# Patient Record
Sex: Male | Born: 1960
Health system: Southern US, Community
[De-identification: ages and names within clinical notes are randomized; demographics above are authoritative.]

## PROBLEM LIST (undated history)

## (undated) DIAGNOSIS — N529 Male erectile dysfunction, unspecified: Secondary | ICD-10-CM

## (undated) DIAGNOSIS — C61 Malignant neoplasm of prostate: Secondary | ICD-10-CM

## (undated) HISTORY — DX: Malignant neoplasm of prostate: C61

## (undated) HISTORY — DX: Male erectile dysfunction, unspecified: N52.9

---

## 1980-04-16 HISTORY — PX: OTHER SURGICAL HISTORY: SHX169

## 2012-12-03 HISTORY — PX: PROSTATE BIOPSY: SHX241

## 2012-12-29 ENCOUNTER — Institutional Professional Consult (permissible substitution): Payer: Self-pay | Admitting: Radiation Oncology

## 2012-12-29 ENCOUNTER — Ambulatory Visit: Payer: 59

## 2013-01-02 ENCOUNTER — Encounter: Payer: Self-pay | Admitting: Radiation Oncology

## 2013-01-02 DIAGNOSIS — C61 Malignant neoplasm of prostate: Secondary | ICD-10-CM | POA: Insufficient documentation

## 2013-01-02 HISTORY — DX: Malignant neoplasm of prostate: C61

## 2013-01-02 NOTE — Progress Notes (Signed)
GU Location of Tumor / Histology: adenocarcinoma of the prostate  If Prostate Cancer, Gleason Score is (3 + 3=6) and PSA is (3.53)  Patient presented in August with an elevated PSA of 4.63.  Biopsies of prostate (if applicable) revealed:     Past/Anticipated interventions by urology, if any: Dr. Annabell Howells referred patient to Dr. Kathrynn Running  Past/Anticipated interventions by medical oncology, if any: NONE  Weight changes, if any: None noted  Bowel/Bladder complaints, if any: mild ED and minimal voiding difficulties IPSS 3   Nausea/Vomiting, if any: None noted  Pain issues, if any:  None noted  SAFETY ISSUES:  Prior radiation? NO  Pacemaker/ICD? NO  Possible current pregnancy? NO  Is the patient on methotrexate? NO  Current Complaints / other details:  52 year old. Married. NKDA. Prostate volume: 24 cc. [employer] Location manager. T1cNxMx. Patient most interested in brachytherapy.

## 2013-01-05 ENCOUNTER — Encounter: Payer: Self-pay | Admitting: Radiation Oncology

## 2013-01-05 ENCOUNTER — Ambulatory Visit
Admission: RE | Admit: 2013-01-05 | Discharge: 2013-01-05 | Disposition: A | Discharge status: Home / Self Care | Payer: 59 | Source: Ambulatory Visit | Attending: Radiation Oncology | Admitting: Radiation Oncology

## 2013-01-05 ENCOUNTER — Telehealth: Payer: Self-pay | Admitting: *Deleted

## 2013-01-05 VITALS — BP 133/81 | HR 61 | Temp 98.0°F | Resp 16 | Ht 72.0 in | Wt 209.9 lb

## 2013-01-05 DIAGNOSIS — C61 Malignant neoplasm of prostate: Secondary | ICD-10-CM

## 2013-01-05 NOTE — Telephone Encounter (Signed)
Called patient to inform of pre-seed appt. For 01-09-13- arrival time - 8:30 am, spoke with patient and he is aware of this appt.

## 2013-01-05 NOTE — Progress Notes (Signed)
Reports on average he gets up once during the night to void. Denies burning with urination. Denies hematuria. Denies urinary frequency during the day reporting he can go 2-3 hours without voiding however, if he drinks more he goes more. Reports a strong urine stream and denies difficulty emptying. Denies pain associated with bowel movements, blood in stool or diarrhea. Denies any unintentional weight loss. Reports a good appetite. Reports occasional night sweats. Denies bone pain. Denies nausea, vomiting, headaches or dizziness.    Most interested in brachytherapy. IPSS 1.

## 2013-01-05 NOTE — Progress Notes (Signed)
See progress note under physician encounter. 

## 2013-01-05 NOTE — Progress Notes (Signed)
Radiation Oncology         [phone]) [phone] ________________________________  Initial outpatient Consultation  Name: Seth Hammond MRN: [id number]  Date: 01/05/2013  DOB: [birth year]  CC:No primary provider on file.  Bjorn Pippin, MD   REFERRING PHYSICIAN: Bjorn Pippin, MD  DIAGNOSIS: 52 y.o. gentleman with stage T1c adenocarcinoma of the prostate with a Gleason's score of 3+3 and a PSA of 4.63  HISTORY OF PRESENT ILLNESS::Seth Hammond is a 52 y.o. gentleman.  He was noted to have an elevated PSA of 4.63 by his primary care physician, Dr. Mauricio Hammond.  Accordingly, he was referred for evaluation in urology by Dr. Annabell Howells on 11/19/12,  digital rectal examination was performed at that time revealing no nodules, although the left lobe was somewhat more prominent than the right.  Repeat PSA at that time was 3.53. The patient proceeded to transrectal ultrasound with 12 biopsies of the prostate on 12/03/12.  The prostate volume measured 24 cc.  Out of 12 core biopsies, 3 were positive.  The maximum Gleason score was 3+3, and this was seen in areas shown below.  The patient reviewed the biopsy results with his urologist and he has kindly been referred today for discussion of potential radiation treatment options.  PREVIOUS RADIATION THERAPY: No  PAST MEDICAL HISTORY:  has a past medical history of Hypertrophy of prostate without urinary obstruction and other lower urinary tract symptoms (LUTS); Impotence of organic origin; Malignant neoplasm of prostate; Elevated prostate specific antigen (PSA); and Prostate cancer (01/02/2013).    PAST SURGICAL HISTORY: Past Surgical History  Procedure Laterality Date  . Prostate biopsy  12/03/2012  . Orchlopexy inguinal approach left      FAMILY HISTORY: family history includes Aneurysm in his father; Cancer in his maternal uncle and paternal uncle.  SOCIAL HISTORY:  reports that he has never smoked. He has never used smokeless tobacco. He reports that he does not drink  alcohol or use illicit drugs.  ALLERGIES: Review of patient's allergies indicates no known allergies.  MEDICATIONS:  Current Outpatient Prescriptions  Medication Sig Dispense Refill  . valACYclovir (VALTREX) 500 MG tablet        No current facility-administered medications for this encounter.    REVIEW OF SYSTEMS:  A 15 point review of systems is documented in the electronic medical record. This was obtained by the nursing staff. However, I reviewed this with the patient to discuss relevant findings and make appropriate changes.  A comprehensive review of systems was negative..  The patient completed an IPSS and IIEF questionnaire.  His IPSS score was 1 indicating mild urinary outflow obstructive symptoms.  He indicated that his erectile function is able to complete sexual activity more than half the time.   PHYSICAL EXAM: This patient is in no acute distress.  He is alert and oriented.   height is 6' (1.829 m) and weight is 209 lb 14.4 oz (95.21 kg). His oral temperature is 98 F (36.7 C). His blood pressure is 133/81 and his pulse is 61. His respiration is 16 and oxygen saturation is 100%.  He exhibits no respiratory distress or labored breathing.  He appears neurologically intact.  His mood is pleasant.  His affect is appropriate.  Please note the digital rectal exam findings described above.  KPS = 100    IMPRESSION: This gentleman is a 52 y.o. gentleman with stage T1c adenocarcinoma of the prostate with a Gleason's score of 3+3 and a PSA of 4.63.  His T-Stage, Gleason's Score,  and PSA put him into the favorable risk group.  Accordingly he is eligible for a variety of potential treatment options including prostatectomy, IMRT or seed implant.  PLAN:Today I reviewed the findings and workup thus far.  We discussed the natural history of prostate cancer.  We reviewed the the implications of T-stage, Gleason's Score, and PSA on decision-making and outcomes in prostate cancer.  We discussed  radiation treatment in the management of prostate cancer with regard to the logistics and delivery of external beam radiation treatment as well as the logistics and delivery of prostate brachytherapy.  We compared and contrasted each of these approaches and also compared these against prostatectomy.  The patient expressed interest in prostate brachytherapy.  I filled out a patient counseling form for him with relevant treatment diagrams and we retained a copy for our records.   The patient would like to proceed with prostate brachytherapy.  I will share my findings with Dr. Annabell Howells and move forward with scheduling the procedure in the near future.     I enjoyed meeting with him today, and will look forward to participating in the care of this very nice gentleman.  I spent 40 minutes face to face with the patient and more than 50% of that time was spent in counseling and/or coordination of care.   ------------------------------------------------  Artist Pais. Kathrynn Running, M.D.

## 2013-01-05 NOTE — Progress Notes (Signed)
Complete PATIENT MEASURE OF DISTRESS worksheet with a score of 1 submitted to social work. 

## 2013-01-06 ENCOUNTER — Telehealth: Payer: Self-pay | Admitting: *Deleted

## 2013-01-06 NOTE — Telephone Encounter (Signed)
CALLED PATIENT TO INFORM OF IMPLANT DATE, LVM FOR A RETURN CALL 

## 2013-01-07 ENCOUNTER — Other Ambulatory Visit: Payer: Self-pay | Admitting: Urology

## 2013-01-08 ENCOUNTER — Telehealth: Payer: Self-pay | Admitting: *Deleted

## 2013-01-08 NOTE — Telephone Encounter (Signed)
CALLED PATIENT TO REMIND OF APPTS. FOR 01-09-13, SPOKE WITH PATIENT AND HE IS AWARE OF THESE APPTS.

## 2013-01-09 ENCOUNTER — Encounter: Payer: Self-pay | Admitting: Radiation Oncology

## 2013-01-09 ENCOUNTER — Ambulatory Visit
Admission: RE | Admit: 2013-01-09 | Discharge: 2013-01-09 | Disposition: A | Discharge status: Home / Self Care | Payer: 59 | Source: Ambulatory Visit | Attending: Radiation Oncology | Admitting: Radiation Oncology

## 2013-01-09 ENCOUNTER — Encounter (HOSPITAL_BASED_OUTPATIENT_CLINIC_OR_DEPARTMENT_OTHER)
Admission: RE | Admit: 2013-01-09 | Discharge: 2013-01-09 | Disposition: A | Discharge status: Home / Self Care | Payer: 59 | Source: Ambulatory Visit | Attending: Urology | Admitting: Urology

## 2013-01-09 ENCOUNTER — Ambulatory Visit (HOSPITAL_COMMUNITY)
Admission: RE | Admit: 2013-01-09 | Discharge: 2013-01-09 | Disposition: A | Discharge status: Home / Self Care | Payer: 59 | Source: Ambulatory Visit | Attending: Urology | Admitting: Urology

## 2013-01-09 ENCOUNTER — Other Ambulatory Visit: Payer: Self-pay

## 2013-01-09 DIAGNOSIS — C61 Malignant neoplasm of prostate: Secondary | ICD-10-CM

## 2013-01-09 DIAGNOSIS — R9431 Abnormal electrocardiogram [ECG] [EKG]: Secondary | ICD-10-CM | POA: Insufficient documentation

## 2013-01-09 DIAGNOSIS — I252 Old myocardial infarction: Secondary | ICD-10-CM | POA: Insufficient documentation

## 2013-01-09 DIAGNOSIS — Z01818 Encounter for other preprocedural examination: Secondary | ICD-10-CM | POA: Insufficient documentation

## 2013-01-09 DIAGNOSIS — Z01810 Encounter for preprocedural cardiovascular examination: Secondary | ICD-10-CM | POA: Insufficient documentation

## 2013-01-09 NOTE — Progress Notes (Signed)
  Radiation Oncology         [phone]) [phone] ________________________________  Name: Seth Hammond MRN: [id number]  Date: 01/09/2013  DOB: [birth year]  SIMULATION AND TREATMENT PLANNING NOTE PUBIC ARCH STUDY  CC:No primary provider on file.  Bjorn Pippin, MD  DIAGNOSIS: 53 y.o. gentleman with stage T1c adenocarcinoma of the prostate with a Gleason's score of 3+3 and a PSA of 4.63  COMPLEX SIMULATION:  The patient presented today for evaluation for possible prostate seed implant. He was brought to the radiation planning suite and placed supine on the CT couch. A 3-dimensional image study set was obtained in upload to the planning computer. There, on each axial slice, I contoured the prostate gland. Then, using three-dimensional radiation planning tools I reconstructed the prostate in view of the structures from the transperineal needle pathway to assess for possible pubic arch interference. In doing so, I did not appreciate any pubic arch interference. Also, the patient's prostate volume was estimated based on the drawn structure. The volume was 34 cc which is close to the ultrasound volume of 24 cc, plus the increase in size typical on CT.  Given the pubic arch appearance and prostate volume, patient remains a good candidate to proceed with prostate seed implant. Today, he freely provided informed written consent to proceed.    PLAN: The patient will undergo prostate seed implant.   ________________________________  Artist Pais. Kathrynn Running, M.D.

## 2013-01-26 ENCOUNTER — Encounter: Payer: Self-pay | Admitting: Radiation Oncology

## 2013-01-28 ENCOUNTER — Telehealth: Payer: Self-pay | Admitting: Radiation Oncology

## 2013-01-28 NOTE — Telephone Encounter (Signed)
Phoned patient. No answer. Left message informing him requested work letter is ready for pick up.

## 2013-01-30 ENCOUNTER — Telehealth: Payer: Self-pay | Admitting: Radiation Oncology

## 2013-01-30 ENCOUNTER — Encounter: Payer: Self-pay | Admitting: Radiation Oncology

## 2013-01-30 NOTE — Telephone Encounter (Signed)
Corrected date on letter to December 3 to ensure patient is paid for time out of work following surgery per [facility] guidelines. Patient apologized for confusion and expressed appreciation that matter was handled so quickly.

## 2013-02-25 ENCOUNTER — Telehealth: Payer: Self-pay | Admitting: *Deleted

## 2013-02-25 NOTE — Telephone Encounter (Signed)
CALLED PATIENT TO REMIND OF APPT. FOR LAB ON 02-26-13, LVM FOR A RETURN CALL

## 2013-02-26 LAB — COMPREHENSIVE METABOLIC PANEL
AST: 24 U/L (ref 0–37)
Albumin: 3.7 g/dL (ref 3.5–5.2)
BUN: 15 mg/dL (ref 6–23)
Calcium: 9.2 mg/dL (ref 8.4–10.5)
Chloride: 103 mEq/L (ref 96–112)
Creatinine, Ser: 1.05 mg/dL (ref 0.50–1.35)
GFR calc non Af Amer: 79 mL/min — ABNORMAL LOW (ref >90)
Potassium: 4 mEq/L (ref 3.5–5.1)
Total Bilirubin: 0.3 mg/dL (ref 0.3–1.2)

## 2013-02-26 LAB — PROTIME-INR
INR: 0.98 (ref 0.00–1.49)
Prothrombin Time: 12.8 seconds (ref 11.6–15.2)

## 2013-02-26 LAB — CBC
HCT: 46.6 % (ref 39.0–52.0)
MCH: 27.9 pg (ref 26.0–34.0)
MCV: 82.3 fL (ref 78.0–100.0)
Platelets: 153 10*3/uL (ref 150–400)
RDW: 14.3 % (ref 11.5–15.5)
WBC: 3.9 10*3/uL — ABNORMAL LOW (ref 4.0–10.5)

## 2013-03-02 ENCOUNTER — Encounter (HOSPITAL_BASED_OUTPATIENT_CLINIC_OR_DEPARTMENT_OTHER): Payer: Self-pay | Admitting: *Deleted

## 2013-03-02 NOTE — Progress Notes (Signed)
NPO AFTER MN WITH EXCEPTION CLEAR LIQUIDS UNTIL 0800. ARRIVE AT 1230. CURRENT LAB RESULTS, CXR, AND EKG IN EPIC AND CHART. WILL DO FLEET ENEMA AM DOS. 

## 2013-03-03 NOTE — H&P (Signed)
eason For Visit  [name] presents today for a pre op appointment   History of Present Illness         [name] Si had a prostate biopsy that was done for a PSA of 3.53 and prostate asymmetry.  His prostate was 24cc and he had 3 cores with Gleason 6 in the right prostate with 5-30% involvement.  He has T1c Nx Mx disease with a [place] score of 1 and a low risk D'Amico classification.  His IPSS was 3 and his SHIM was 18.  After discussing all of his options for treatment, he has elected to proceed with brachytherapy.  His seeds are scheduled to be implanted on 03/05/13.  Interval history: [name] presents today for his pre op appointment.  He is doing well today.  He denies any bothersome LUTS, dysuria, or hematuria.  He denies any pain.  He does not have any questions about his upcoming surgery. He is without complaints today.   Past Medical History Problems   1. History of No Medical Problems  Surgical History Problems   1. History of Orchiopexy Inguinal Approach Left  Current Meds  1. Fluticasone Propionate 50 MCG/ACT Nasal Suspension;  Therapy: 21Oct2014 to Recorded  2. ValACYclovir HCl - 500 MG Oral Tablet;  Therapy: 21Oct2014 to Recorded  Allergies Medication   1. No Known Drug Allergies  Family History Problems   1. Family history of Death In The Family Father   Ddied age 77-Aneurysm  2. Family history of Family Health Status Number Of Children   2 sons  Social History Problems   1. Denied: History of Alcohol Use  2. Caffeine Use   1 per day  3. Marital History - Currently Married  4. Never A Smoker  5. Occupation:   Oncologist  6. Denied: History of Tobacco Use (305.1)  Review of Systems Genitourinary, constitutional and gastrointestinal system(s) were reviewed and pertinent findings if present are noted.    Vitals Vital Signs [Data Includes: Last 1 Day]  Recorded: 10Nov2014 08:33AM  Blood Pressure: 146 / 84 Temperature: 98.7 F Heart  Rate: 59  Physical Exam Constitutional: Well nourished and well developed . No acute distress.  Pulmonary: No respiratory distress and normal respiratory rhythm and effort.  Abdomen: The abdomen is soft and nontender. No CVA tenderness.  Neuro/Psych:. Mood and affect are appropriate.    Results/Data Urine [Data Includes: Last 1 Day]   10Nov2014  COLOR YELLOW   APPEARANCE CLEAR   SPECIFIC GRAVITY 1.025   pH 6.0   GLUCOSE NEG mg/dL  BILIRUBIN NEG   KETONE NEG mg/dL  BLOOD SMALL   PROTEIN NEG mg/dL  UROBILINOGEN 0.2 mg/dL  NITRITE NEG   LEUKOCYTE ESTERASE NEG   SQUAMOUS EPITHELIAL/HPF RARE   WBC 0-2 WBC/hpf  RBC 0-2 RBC/hpf  BACTERIA RARE   CRYSTALS NONE SEEN   CASTS NONE SEEN    Assessment Assessed   1. Prostate cancer (185)   [name] is doing well today.  He is asymptomatic from a GU perspective and his urine is clear.  He is prepared for his upcoming brachytherapy implantation surgery.  He will proceed as scheduled but knows to call should he have any questions or concerns.   End of Encounter Meds  Medication Name Instruction  Fluticasone Propionate 50 MCG/ACT Nasal Suspension   ValACYclovir HCl - 500 MG Oral Tablet    Plan Health Maintenance   1. UA With REFLEX; Status:Complete;   Done: 10Nov2014 08:23AM   1.  Follow up as scheduled for surgery and pre-op/ post op   Future Appointments  Date/Time Provider Specialty Site  03/05/2013 02:00 PM Bjorn Pippin, M.D. Urology [facility]  03/26/2013 11:00 AM Corky Crafts Urology [facility]

## 2013-03-04 ENCOUNTER — Telehealth: Payer: Self-pay | Admitting: *Deleted

## 2013-03-04 NOTE — Telephone Encounter (Signed)
Called patient to remind of procedure for 03-05-13, spoke with patient and he is aware of this procedure. 

## 2013-03-05 ENCOUNTER — Ambulatory Visit (HOSPITAL_BASED_OUTPATIENT_CLINIC_OR_DEPARTMENT_OTHER): Payer: 59 | Admitting: Anesthesiology

## 2013-03-05 ENCOUNTER — Ambulatory Visit (HOSPITAL_COMMUNITY): Payer: 59

## 2013-03-05 ENCOUNTER — Encounter (HOSPITAL_BASED_OUTPATIENT_CLINIC_OR_DEPARTMENT_OTHER)
Admission: RE | Disposition: A | Discharge status: Home / Self Care | Payer: Self-pay | Source: Ambulatory Visit | Attending: Urology

## 2013-03-05 ENCOUNTER — Encounter (HOSPITAL_BASED_OUTPATIENT_CLINIC_OR_DEPARTMENT_OTHER): Payer: Self-pay | Admitting: *Deleted

## 2013-03-05 ENCOUNTER — Ambulatory Visit (HOSPITAL_BASED_OUTPATIENT_CLINIC_OR_DEPARTMENT_OTHER)
Admission: RE | Admit: 2013-03-05 | Discharge: 2013-03-05 | Disposition: A | Discharge status: Home / Self Care | Payer: 59 | Source: Ambulatory Visit | Attending: Urology | Admitting: Urology

## 2013-03-05 ENCOUNTER — Encounter (HOSPITAL_BASED_OUTPATIENT_CLINIC_OR_DEPARTMENT_OTHER): Payer: 59 | Admitting: Anesthesiology

## 2013-03-05 DIAGNOSIS — C61 Malignant neoplasm of prostate: Secondary | ICD-10-CM | POA: Insufficient documentation

## 2013-03-05 HISTORY — PX: RADIOACTIVE SEED IMPLANT: SHX5150

## 2013-03-05 SURGERY — INSERTION, RADIATION SOURCE, PROSTATE
Anesthesia: General | Site: Prostate | Wound class: Clean

## 2013-03-05 MED ORDER — FLEET ENEMA 7-19 GM/118ML RE ENEM
1.0000 | ENEMA | Freq: Once | RECTAL | Status: AC
  Administered 2013-03-05: 1 via RECTAL
  Filled 2013-03-05: qty 1

## 2013-03-05 MED ORDER — FENTANYL CITRATE 0.05 MG/ML IJ SOLN
25.0000 ug | INTRAMUSCULAR | Status: DC | PRN
  Filled 2013-03-05: qty 1

## 2013-03-05 MED ORDER — CIPROFLOXACIN IN D5W 400 MG/200ML IV SOLN
400.0000 mg | INTRAVENOUS | Status: AC
  Administered 2013-03-05: 400 mg via INTRAVENOUS
  Filled 2013-03-05: qty 200

## 2013-03-05 MED ORDER — DEXAMETHASONE SODIUM PHOSPHATE 4 MG/ML IJ SOLN
INTRAMUSCULAR | Status: DC | PRN
  Administered 2013-03-05: 10 mg via INTRAVENOUS

## 2013-03-05 MED ORDER — LIDOCAINE HCL (CARDIAC) 20 MG/ML IV SOLN
INTRAVENOUS | Status: DC | PRN
  Administered 2013-03-05: 100 mg via INTRAVENOUS

## 2013-03-05 MED ORDER — LACTATED RINGERS IV SOLN
INTRAVENOUS | Status: DC
  Filled 2013-03-05: qty 1000

## 2013-03-05 MED ORDER — FENTANYL CITRATE 0.05 MG/ML IJ SOLN
INTRAMUSCULAR | Status: DC | PRN
  Administered 2013-03-05 (×2): 12.5 ug via INTRAVENOUS
  Administered 2013-03-05: 25 ug via INTRAVENOUS
  Administered 2013-03-05: 12.5 ug via INTRAVENOUS
  Administered 2013-03-05: 50 ug via INTRAVENOUS
  Administered 2013-03-05 (×2): 12.5 ug via INTRAVENOUS
  Administered 2013-03-05: 25 ug via INTRAVENOUS
  Administered 2013-03-05 (×3): 12.5 ug via INTRAVENOUS

## 2013-03-05 MED ORDER — ONDANSETRON HCL 4 MG/2ML IJ SOLN
INTRAMUSCULAR | Status: DC | PRN
  Administered 2013-03-05: 4 mg via INTRAVENOUS

## 2013-03-05 MED ORDER — MIDAZOLAM HCL 5 MG/5ML IJ SOLN
INTRAMUSCULAR | Status: DC | PRN
  Administered 2013-03-05 (×2): 1 mg via INTRAVENOUS

## 2013-03-05 MED ORDER — ONDANSETRON HCL 4 MG/2ML IJ SOLN
4.0000 mg | Freq: Four times a day (QID) | INTRAMUSCULAR | Status: DC | PRN
  Filled 2013-03-05: qty 2

## 2013-03-05 MED ORDER — CIPROFLOXACIN IN D5W 400 MG/200ML IV SOLN
INTRAVENOUS | Status: AC
  Filled 2013-03-05: qty 200

## 2013-03-05 MED ORDER — HYDROCODONE-ACETAMINOPHEN 5-325 MG PO TABS: 1.0000 | ORAL_TABLET | Freq: Four times a day (QID) | ORAL | Status: DC | PRN

## 2013-03-05 MED ORDER — OXYCODONE HCL 5 MG PO TABS
5.0000 mg | ORAL_TABLET | ORAL | Status: DC | PRN
  Filled 2013-03-05: qty 2

## 2013-03-05 MED ORDER — LACTATED RINGERS IV SOLN
INTRAVENOUS | Status: DC | PRN
  Administered 2013-03-05 (×2): via INTRAVENOUS

## 2013-03-05 MED ORDER — IOHEXOL 350 MG/ML SOLN
INTRAVENOUS | Status: DC | PRN
  Administered 2013-03-05: 7 mL

## 2013-03-05 MED ORDER — KETOROLAC TROMETHAMINE 30 MG/ML IJ SOLN
INTRAMUSCULAR | Status: DC | PRN
  Administered 2013-03-05: 30 mg via INTRAVENOUS

## 2013-03-05 MED ORDER — CIPROFLOXACIN HCL 500 MG PO TABS: 500.0000 mg | ORAL_TABLET | Freq: Two times a day (BID) | ORAL | Status: DC

## 2013-03-05 MED ORDER — TAMSULOSIN HCL 0.4 MG PO CAPS: 0.4000 mg | ORAL_CAPSULE | Freq: Every day | ORAL | Status: AC

## 2013-03-05 MED ORDER — STERILE WATER FOR IRRIGATION IR SOLN
Status: DC | PRN
  Administered 2013-03-05: 3000 mL via INTRAVESICAL

## 2013-03-05 MED ORDER — SODIUM CHLORIDE 0.9 % IJ SOLN
3.0000 mL | Freq: Two times a day (BID) | INTRAMUSCULAR | Status: DC
  Filled 2013-03-05: qty 3

## 2013-03-05 MED ORDER — LACTATED RINGERS IV SOLN
INTRAVENOUS | Status: DC
  Administered 2013-03-05: 13:00:00 via INTRAVENOUS
  Filled 2013-03-05: qty 1000

## 2013-03-05 MED ORDER — STERILE WATER FOR IRRIGATION IR SOLN
Status: DC | PRN
  Administered 2013-03-05: 3 mL

## 2013-03-05 MED ORDER — SODIUM CHLORIDE 0.9 % IJ SOLN
3.0000 mL | INTRAMUSCULAR | Status: DC | PRN
  Filled 2013-03-05: qty 3

## 2013-03-05 MED ORDER — SODIUM CHLORIDE 0.9 % IV SOLN
250.0000 mL | INTRAVENOUS | Status: DC | PRN
  Filled 2013-03-05: qty 250

## 2013-03-05 MED ORDER — ACETAMINOPHEN 325 MG PO TABS
650.0000 mg | ORAL_TABLET | ORAL | Status: DC | PRN
  Filled 2013-03-05: qty 2

## 2013-03-05 MED ORDER — PROPOFOL 10 MG/ML IV BOLUS
INTRAVENOUS | Status: DC | PRN
  Administered 2013-03-05: 250 mg via INTRAVENOUS

## 2013-03-05 MED ORDER — ACETAMINOPHEN 650 MG RE SUPP
650.0000 mg | RECTAL | Status: DC | PRN
  Filled 2013-03-05: qty 1

## 2013-03-05 SURGICAL SUPPLY — 26 items
BAG URINE DRAINAGE (UROLOGICAL SUPPLIES) ×2 IMPLANT
BLADE SURG ROTATE 9660 (MISCELLANEOUS) ×2 IMPLANT
CATH FOLEY 2WAY SLVR  5CC 16FR (CATHETERS) ×1
CATH FOLEY 2WAY SLVR 5CC 16FR (CATHETERS) ×1 IMPLANT
CATH ROBINSON RED A/P 20FR (CATHETERS) ×2 IMPLANT
CLOTH BEACON ORANGE TIMEOUT ST (SAFETY) ×2 IMPLANT
COVER MAYO STAND STRL (DRAPES) ×2 IMPLANT
COVER TABLE BACK 60X90 (DRAPES) ×2 IMPLANT
DRSG TEGADERM 4X4.75 (GAUZE/BANDAGES/DRESSINGS) ×4 IMPLANT
DRSG TEGADERM 8X12 (GAUZE/BANDAGES/DRESSINGS) ×2 IMPLANT
GAUZE SPONGE 4X4 12PLY STRL LF (GAUZE/BANDAGES/DRESSINGS) ×2 IMPLANT
GLOVE BIO SURGEON STRL SZ 6.5 (GLOVE) ×2 IMPLANT
GLOVE BIO SURGEON STRL SZ7.5 (GLOVE) IMPLANT
GLOVE ECLIPSE 8.0 STRL XLNG CF (GLOVE) ×8 IMPLANT
GLOVE INDICATOR 6.5 STRL GRN (GLOVE) ×2 IMPLANT
GLOVE INDICATOR 7.5 STRL GRN (GLOVE) ×4 IMPLANT
GLOVE SURG SS PI 8.0 STRL IVOR (GLOVE) ×4 IMPLANT
GOWN PREVENTION PLUS LG XLONG (DISPOSABLE) ×2 IMPLANT
GOWN STRL REIN XL XLG (GOWN DISPOSABLE) ×2 IMPLANT
HOLDER FOLEY CATH W/STRAP (MISCELLANEOUS) ×2 IMPLANT
PACK CYSTOSCOPY (CUSTOM PROCEDURE TRAY) ×2 IMPLANT
SYRINGE 10CC LL (SYRINGE) ×4 IMPLANT
UNDERPAD 30X30 INCONTINENT (UNDERPADS AND DIAPERS) ×4 IMPLANT
WATER STERILE IRR 3000ML UROMA (IV SOLUTION) ×2 IMPLANT
WATER STERILE IRR 500ML POUR (IV SOLUTION) ×2 IMPLANT
radioactive seed ×144 IMPLANT

## 2013-03-05 NOTE — Transfer of Care (Signed)
Immediate Anesthesia Transfer of Care Note  Patient: Seth Hammond  Procedure(s) Performed: Procedure(s) (LRB): RADIOACTIVE SEED IMPLANT (N/A)  Patient Location: PACU  Anesthesia Type: General  Level of Consciousness: awake, sedated, patient cooperative and responds to stimulation  Airway & Oxygen Therapy: Patient Spontanous Breathing and Patient connected to face mask oxygen  Post-op Assessment: Report given to PACU RN, Post -op Vital signs reviewed and stable and Patient moving all extremities  Post vital signs: Reviewed and stable  Complications: No apparent anesthesia complications

## 2013-03-05 NOTE — Anesthesia Postprocedure Evaluation (Signed)
Anesthesia Post Note  Patient: Seth Hammond  Procedure(s) Performed: Procedure(s) (LRB): RADIOACTIVE SEED IMPLANT (N/A)  Anesthesia type: General  Patient location: PACU  Post pain: Pain level controlled  Post assessment: Post-op Vital signs reviewed  Last Vitals:  Filed Vitals:   03/05/13 1615  BP: 149/94  Pulse: 65  Temp:   Resp: 14    Post vital signs: Reviewed  Level of consciousness: sedated  Complications: No apparent anesthesia complications

## 2013-03-05 NOTE — Op Note (Addendum)
PATIENT:  Seth Hammond  PRE-OPERATIVE DIAGNOSIS:  Adenocarcinoma of the prostate  POST-OPERATIVE DIAGNOSIS:  Same  PROCEDURE:  Procedure(s): 1. I-125 radioactive seed implantation 2. Cystoscopy  SURGEON:  Surgeon(s): Bjorn Pippin MD  Radiation oncologist: Dr. Margaretmary Dys  ANESTHESIA:  General  EBL:  Minimal  DRAINS: 16 French Foley catheter  INDICATION: Seth Hammond is a 52 yo with T1c gleason 6 prostate cancer who has elected a seed implant for therapy.  Description of procedure: After informed consent the patient was brought to the major OR, placed on the table and administered general anesthesia. He was then moved to the modified lithotomy position with his perineum perpendicular to the floor. His perineum and genitalia were then sterilely prepped. An official timeout was then performed. A 16 French Foley catheter was then placed in the bladder and filled with dilute contrast, a rectal tube was placed in the rectum and the transrectal ultrasound probe was placed in the rectum and affixed to the stand. He was then sterilely draped.  The sterile grid was installed.   Anchor needles were then placed.   Real time ultrasonography was used along with the seed planning software spot-pro version 3.1-00. This was used to develop the seed plan including the number of needles as well as number of seeds required for complete and adequate coverage. Real-time ultrasonography was then used along with the previously developed plan and the Nucletron device to implant a total of 74 seeds (72 post cysto) using 26 needles. This proceeded without difficulty or complication.  A Foley catheter was then removed as well as the transrectal ultrasound probe and rectal probe. Flexible cystoscopy was then performed using the 17 French flexible scope which revealed a normal urethra throughout its length down to the sphincter which appeared intact. The prostatic urethra was about 3cm in length with bilobar  hyperplasia with minimal obstruction.  There was some minor bleeding in the prostate and 2 seeds were noted.. The bladder was then entered and fully and systematically.  The ureteral orifices were noted to be of normal configuration and position. The mucosa revealed no evidence of tumors. There were also no stones identified within the bladder. I flushed the seeds into the bladder where they were removed with grasping forceps.    The cystoscope was then removed. The bladder was drained with a RR catheter.  The drapes were removed.  The perineum was cleaned and dressed.  He was taken out of the lithotomy position and was awakened and taken to recovery room in stable and satisfactory condition. He tolerated procedure well and there were no intraoperative complications.

## 2013-03-05 NOTE — H&P (View-Only) (Signed)
NPO AFTER MN WITH EXCEPTION CLEAR LIQUIDS UNTIL 0800. ARRIVE AT 1230. CURRENT LAB RESULTS, CXR, AND EKG IN EPIC AND CHART. WILL DO FLEET ENEMA AM DOS.

## 2013-03-05 NOTE — Anesthesia Procedure Notes (Signed)
Procedure Name: LMA Insertion Date/Time: 03/05/2013 2:23 PM Performed by: Jessica Priest Pre-anesthesia Checklist: Patient identified, Emergency Drugs available, Suction available and Patient being monitored Patient Re-evaluated:Patient Re-evaluated prior to inductionOxygen Delivery Method: Circle System Utilized Preoxygenation: Pre-oxygenation with 100% oxygen Intubation Type: IV induction Ventilation: Mask ventilation without difficulty LMA: LMA inserted LMA Size: 5.0 Number of attempts: 1 Airway Equipment and Method: bite block Placement Confirmation: positive ETCO2 Tube secured with: Tape Dental Injury: Teeth and Oropharynx as per pre-operative assessment

## 2013-03-05 NOTE — Anesthesia Preprocedure Evaluation (Addendum)
Anesthesia Evaluation  Patient identified by MRN, date of birth, ID band Patient awake    Reviewed: Allergy & Precautions, H&P , NPO status , Patient's Chart, lab work & pertinent test results  Airway Mallampati: II TM Distance: >3 FB Neck ROM: full    Dental no notable dental hx. (+) Teeth Intact and Dental Advisory Given   Pulmonary neg pulmonary ROS,  breath sounds clear to auscultation  Pulmonary exam normal       Cardiovascular Exercise Tolerance: Good negative cardio ROS  Rhythm:regular Rate:Normal     Neuro/Psych negative neurological ROS  negative psych ROS   GI/Hepatic negative GI ROS, Neg liver ROS,   Endo/Other  negative endocrine ROS  Renal/GU negative Renal ROS  negative genitourinary   Musculoskeletal   Abdominal   Peds  Hematology negative hematology ROS (+)   Anesthesia Other Findings   Reproductive/Obstetrics negative OB ROS                          Anesthesia Physical Anesthesia Plan  ASA: II  Anesthesia Plan: General   Post-op Pain Management:    Induction: Intravenous  Airway Management Planned: LMA  Additional Equipment:   Intra-op Plan:   Post-operative Plan:   Informed Consent: I have reviewed the patients History and Physical, chart, labs and discussed the procedure including the risks, benefits and alternatives for the proposed anesthesia with the patient or authorized representative who has indicated his/her understanding and acceptance.   Dental Advisory Given  Plan Discussed with: CRNA and Surgeon  Anesthesia Plan Comments:         Anesthesia Quick Evaluation  

## 2013-03-05 NOTE — Interval H&P Note (Signed)
History and Physical Interval Note:  03/05/2013 12:56 PM  Seth Hammond  has presented today for surgery, with the diagnosis of PROSTATE CANCER  The various methods of treatment have been discussed with the patient and family. After consideration of risks, benefits and other options for treatment, the patient has consented to  Procedure(s): RADIOACTIVE SEED IMPLANT (N/A) as a surgical intervention .  The patient's history has been reviewed, patient examined, no change in status, stable for surgery.  I have reviewed the patient's chart and labs.  Questions were answered to the patient's satisfaction.     Kristyn Obyrne J

## 2013-03-06 NOTE — Procedures (Signed)
  Radiation Oncology         [phone] ________________________________  Name: KRAIG GENIS MRN: [id number]  Date: 03/06/2013  DOB: 06-Apr-1961       Prostate Seed Implant   DIAGNOSIS:  52 y.o. gentleman with stage T1c adenocarcinoma of the prostate with a Gleason's score of 3+3 and a PSA of 4.63  PROCEDURE: Insertion of radioactive I-125 seeds into the prostate gland.  RADIATION DOSE: 145 Gy, definitive therapy.  TECHNIQUE: BRENAN MODESTO was brought to the operating room with the urologist. He was placed in the dorsolithotomy position. He was catheterized and a rectal tube was inserted. The perineum was shaved, prepped and draped. The ultrasound probe was then introduced into the rectum to see the prostate gland.  TREATMENT DEVICE: A needle grid was attached to the ultrasound probe stand and anchor needles were placed.  3D PLANNING: The prostate was imaged in 3D using a sagittal sweep of the prostate probe. These images were transferred to the planning computer. There, the prostate, urethra and rectum were defined on each axial reconstructed image. Then, the software created an optimized plan and a few seed positions were adjusted. The quality of the plan was evaluated in terms of dose volume histograms and isodose reconstructions.  Then the accepted plan was uploaded to the seed Selectron afterloading unit.  SPECIAL TREATMENT PROCEDURE/SUPERVISION AND HANDLING: The Nucletron FIRST system was used to place the needles under sagittal guidance. A total of 26 needles were used to deposit 74 seeds in the prostate gland. The individual seed activity was 0.426 mCi for a total implant activity of 31.524 mCi.  COMPLEX SIMULATION: At the end of the procedure, an anterior radiograph of the pelvis was obtained to document seed positioning and count. Cystoscopy was performed to check the urethra and bladder.  MICRODOSIMETRY: At the end of the procedure, the patient was emitting 0.10 mrem/hr at 1  meter. Accordingly, he was considered safe for hospital discharge.  PLAN: The patient will return to the radiation oncology clinic for post implant CT dosimetry in three weeks.   ________________________________  Artist Pais Kathrynn Running, M.D.

## 2013-03-09 ENCOUNTER — Encounter (HOSPITAL_BASED_OUTPATIENT_CLINIC_OR_DEPARTMENT_OTHER): Payer: Self-pay | Admitting: Urology

## 2013-03-09 ENCOUNTER — Telehealth: Payer: Self-pay | Admitting: *Deleted

## 2013-03-09 NOTE — Telephone Encounter (Signed)
CALLED PATIENT TO INFORM THAT LETTER IS READY FOR PICK-UP, SPOKE WITH PATIENT AND HE IS AWARE IT IS READY.

## 2013-03-25 ENCOUNTER — Telehealth: Payer: Self-pay | Admitting: *Deleted

## 2013-03-25 NOTE — Telephone Encounter (Signed)
CALLED PATIENT TO REMIND OF APPTS. FOR 03-26-13, LVM FOR A RETURN CALL

## 2013-03-26 ENCOUNTER — Ambulatory Visit
Admission: RE | Admit: 2013-03-26 | Discharge: 2013-03-26 | Disposition: A | Discharge status: Home / Self Care | Payer: 59 | Source: Ambulatory Visit | Attending: Radiation Oncology | Admitting: Radiation Oncology

## 2013-03-26 ENCOUNTER — Ambulatory Visit
Admit: 2013-03-26 | Discharge: 2013-03-26 | Disposition: A | Discharge status: Home / Self Care | Payer: 59 | Attending: Radiation Oncology | Admitting: Radiation Oncology

## 2013-03-26 ENCOUNTER — Encounter: Payer: Self-pay | Admitting: Radiation Oncology

## 2013-03-26 VITALS — BP 124/78 | HR 67 | Temp 98.4°F | Resp 20

## 2013-03-26 DIAGNOSIS — C61 Malignant neoplasm of prostate: Secondary | ICD-10-CM

## 2013-03-26 NOTE — Progress Notes (Signed)
Pt denies fatigue, changes in BMs after seed implant. He denies pain. IPSS 3 today.

## 2013-03-29 ENCOUNTER — Encounter: Payer: Self-pay | Admitting: Radiation Oncology

## 2013-03-29 NOTE — Progress Notes (Signed)
  Radiation Oncology         [phone] ________________________________  Name: TRUEMAN WORLDS MRN: [id number]  Date: 03/26/2013  DOB: 1960/07/13  Follow-Up Visit Note  CC: No primary provider on file.  Bjorn Pippin, MD  Diagnosis:   52 y.o. gentleman with stage T1c adenocarcinoma of the prostate with a Gleason's score of 3+3 and a PSA of 4.63 s/p I-125 seed implant to 145 Gy on 03/06/13  Interval Since Last Radiation:  3  weeks  Narrative:  The patient returns today for routine follow-up.  He is complaining of increased urinary frequency and urinary hesitation symptoms. He filled out a questionnaire regarding urinary function today providing and overall IPSS score of 4 characterizing his symptoms as mild.  His pre-implant score was 1. He denies any bowel symptoms.  ALLERGIES:  has No Known Allergies.  Meds: Current Outpatient Prescriptions  Medication Sig Dispense Refill  . fluticasone (FLONASE) 50 MCG/ACT nasal spray Place 1 spray into both nostrils as needed for allergies or rhinitis.      . tamsulosin (FLOMAX) 0.4 MG CAPS capsule Take 1 capsule (0.4 mg total) by mouth daily.  30 capsule  1  . valACYclovir (VALTREX) 500 MG tablet Take 500 mg by mouth daily.        No current facility-administered medications for this encounter.    Physical Findings: The patient is in no acute distress. Patient is alert and oriented.  oral temperature is 98.4 F (36.9 C). His blood pressure is 124/78 and his pulse is 67. His respiration is 20. Marland Kitchen  No significant changes.  Lab Findings: Lab Results  Component Value Date   WBC 3.9* 02/26/2013   HGB 15.8 02/26/2013   HCT 46.6 02/26/2013   MCV 82.3 02/26/2013   PLT 153 02/26/2013    Radiographic Findings:  Patient underwent CT imaging in our clinic for post implant dosimetry. The CT appears to demonstrate an adequate distribution of radioactive seeds throughout the prostate gland. There no seeds in her near the rectum. I suspect the final  radiation plan and dosimetry will show appropriate coverage of the prostate gland.   Impression: The patient is recovering from the effects of radiation. His urinary symptoms should gradually improve over the next 4-6 months. We talked about this today. He is encouraged by his improvement already and is otherwise please with his outcome.   Plan: Today, I spent time talking to the patient about his prostate seed implant and resolving urinary symptoms. We also talked about long-term follow-up for prostate cancer following seed implant. He understands that ongoing PSA determinations and digital rectal exams will help perform surveillance to rule out disease recurrence. He understands what to expect with his PSA measures. Patient was also educated today about some of the long-term effects from radiation including a small risk for rectal bleeding and possibly erectile dysfunction. We talked about some of the general management approaches to these potential complications. However, I did encourage the patient to contact our office or return at any point if he has questions or concerns related to his previous radiation and prostate cancer.  _____________________________________  Artist Pais. Kathrynn Running, M.D.

## 2013-03-29 NOTE — Progress Notes (Signed)
  Radiation Oncology         [phone] ________________________________  Name: Seth Hammond  MRN: [id number]  Date: 03/26/2013  DOB: 1961/01/08  COMPLEX SIMULATION NOTE  NARRATIVE:  The patient was brought to the CT Simulation planning suite today following prostate seed implantation approximately one month ago.  Identity was confirmed.  All relevant records and images related to the planned course of therapy were reviewed.  Then, the patient was set-up supine.  CT images were obtained.  The CT images were loaded into the planning software.  Then the prostate and rectum were contoured.  Treatment planning then occurred.  The implanted iodine 125 seeds were identified by the physics staff for projection of radiation distribution  I have requested : 3D Simulation  I have requested a DVH of the following structures: Prostate and rectum.    ________________________________  Artist Pais Kathrynn Running, M.D.

## 2013-04-06 ENCOUNTER — Encounter: Payer: Self-pay | Admitting: Radiation Oncology

## 2013-04-06 NOTE — Progress Notes (Signed)
  Radiation Oncology         [phone] ________________________________  Name: Seth Hammond  MRN: [id number]  Date: 04/06/2013  DOB: 20-Oct-1960  3-D Planning Note Prostate Brachytherapy  Diagnosis: 52 y.o. gentleman with stage T1c adenocarcinoma of the prostate with a Gleason's score of 3+3 and a PSA of 4.63  Narrative: On a previous date, Seth Hammond returned following prostate seed implantation for post implant planning. He underwent CT scan complex simulation to delineate the three-dimensional structures of the pelvis and demonstrate the radiation distribution.  Since that time, the seed localization, and 3D planning with dose volume histograms have now been completed.  Results:   Prostate Coverage - The dose of radiation delivered to the 90% or more of the prostate gland (D90) was 105.22% of the prescription dose. This exceeds our goal of greater than 90%. Rectal Sparing - The volume of rectal tissue receiving the prescription dose or higher was 0.0 cc. This falls under our thresholds tolerance of 1.0 cc.  Impression: The prostate seed implant appears to show adequate target coverage and appropriate rectal sparing.  Plan:  The patient will continue to follow with urology for ongoing PSA determinations. I would anticipate a high likelihood for local tumor control with minimal risk for rectal morbidity.  ________________________________  Artist Pais Kathrynn Running, M.D.

## 2013-04-27 ENCOUNTER — Telehealth: Payer: Self-pay | Admitting: Dietician

## 2013-04-27 NOTE — Telephone Encounter (Signed)
Brief Outpatient Oncology Nutrition Note  Patient has been identified to be at risk on malnutrition screen.  Wt Readings from Last 10 Encounters:  03/05/13 202 lb 8 oz (91.853 kg)  03/05/13 202 lb 8 oz (91.853 kg)   Stage T1c adenocarcinoma of the prostate with a Gleason's score of 3+3 and a PSA of 4.63.  Patient of Dr. Tammi Klippel.  Called and left a message.  Patient is currently unavailable.  Weight loss from 95 kg September 2014.    [place]a RD available as needed.  Antonieta Iba, RD, LDN Clinical Inpatient Dietitian Pager:  [phone] Weekend and after hours pager:  [phone]

## 2013-07-03 ENCOUNTER — Encounter: Payer: Self-pay | Admitting: *Deleted

## 2014-07-29 IMAGING — CR DG CHEST 2V
2 series · 2 of 2 positions shown · non-contrast
Comparison: 07/06/2010

CLINICAL DATA: Preop for seed implants

EXAM:
CHEST  2 VIEW

[w chest pa]
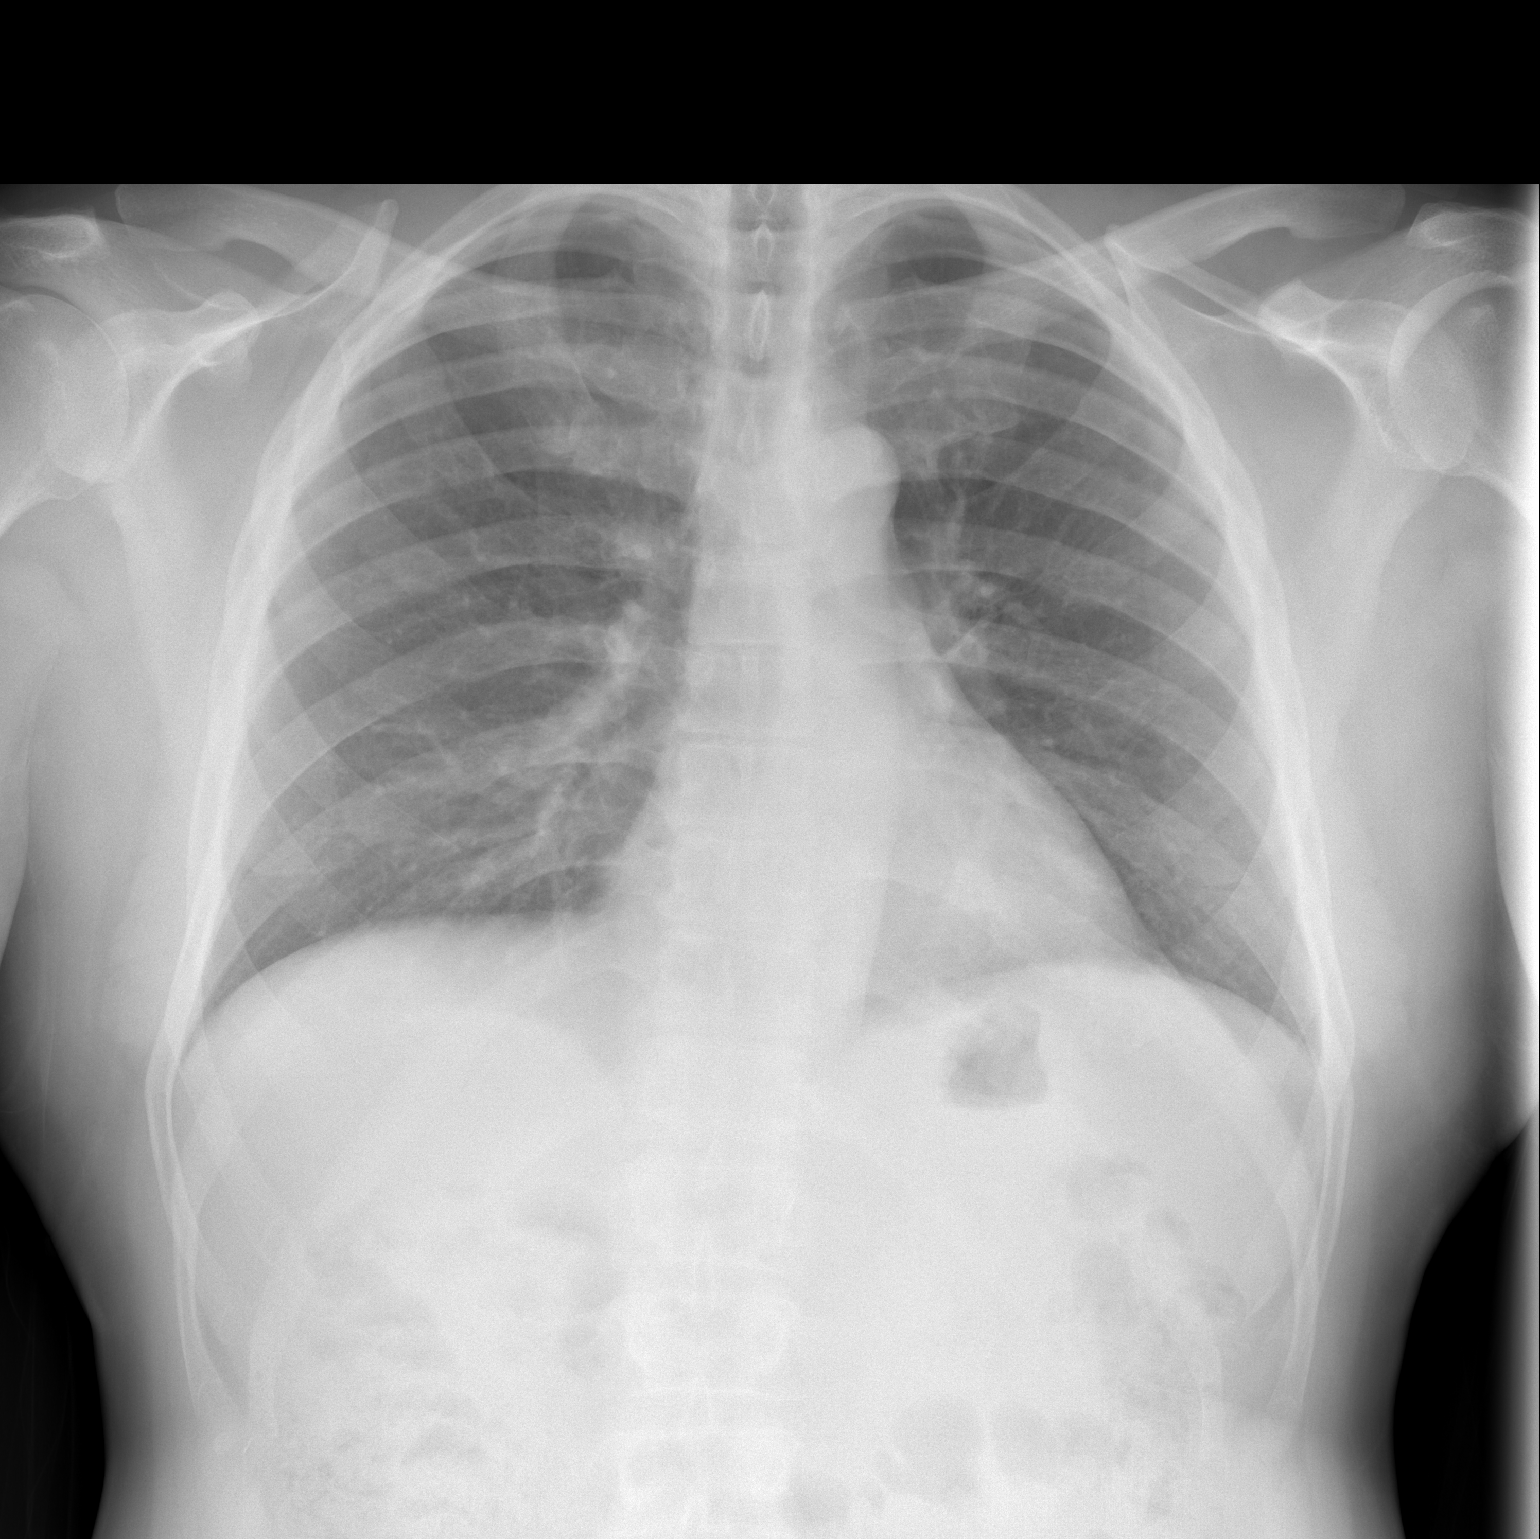

[w chest lat]
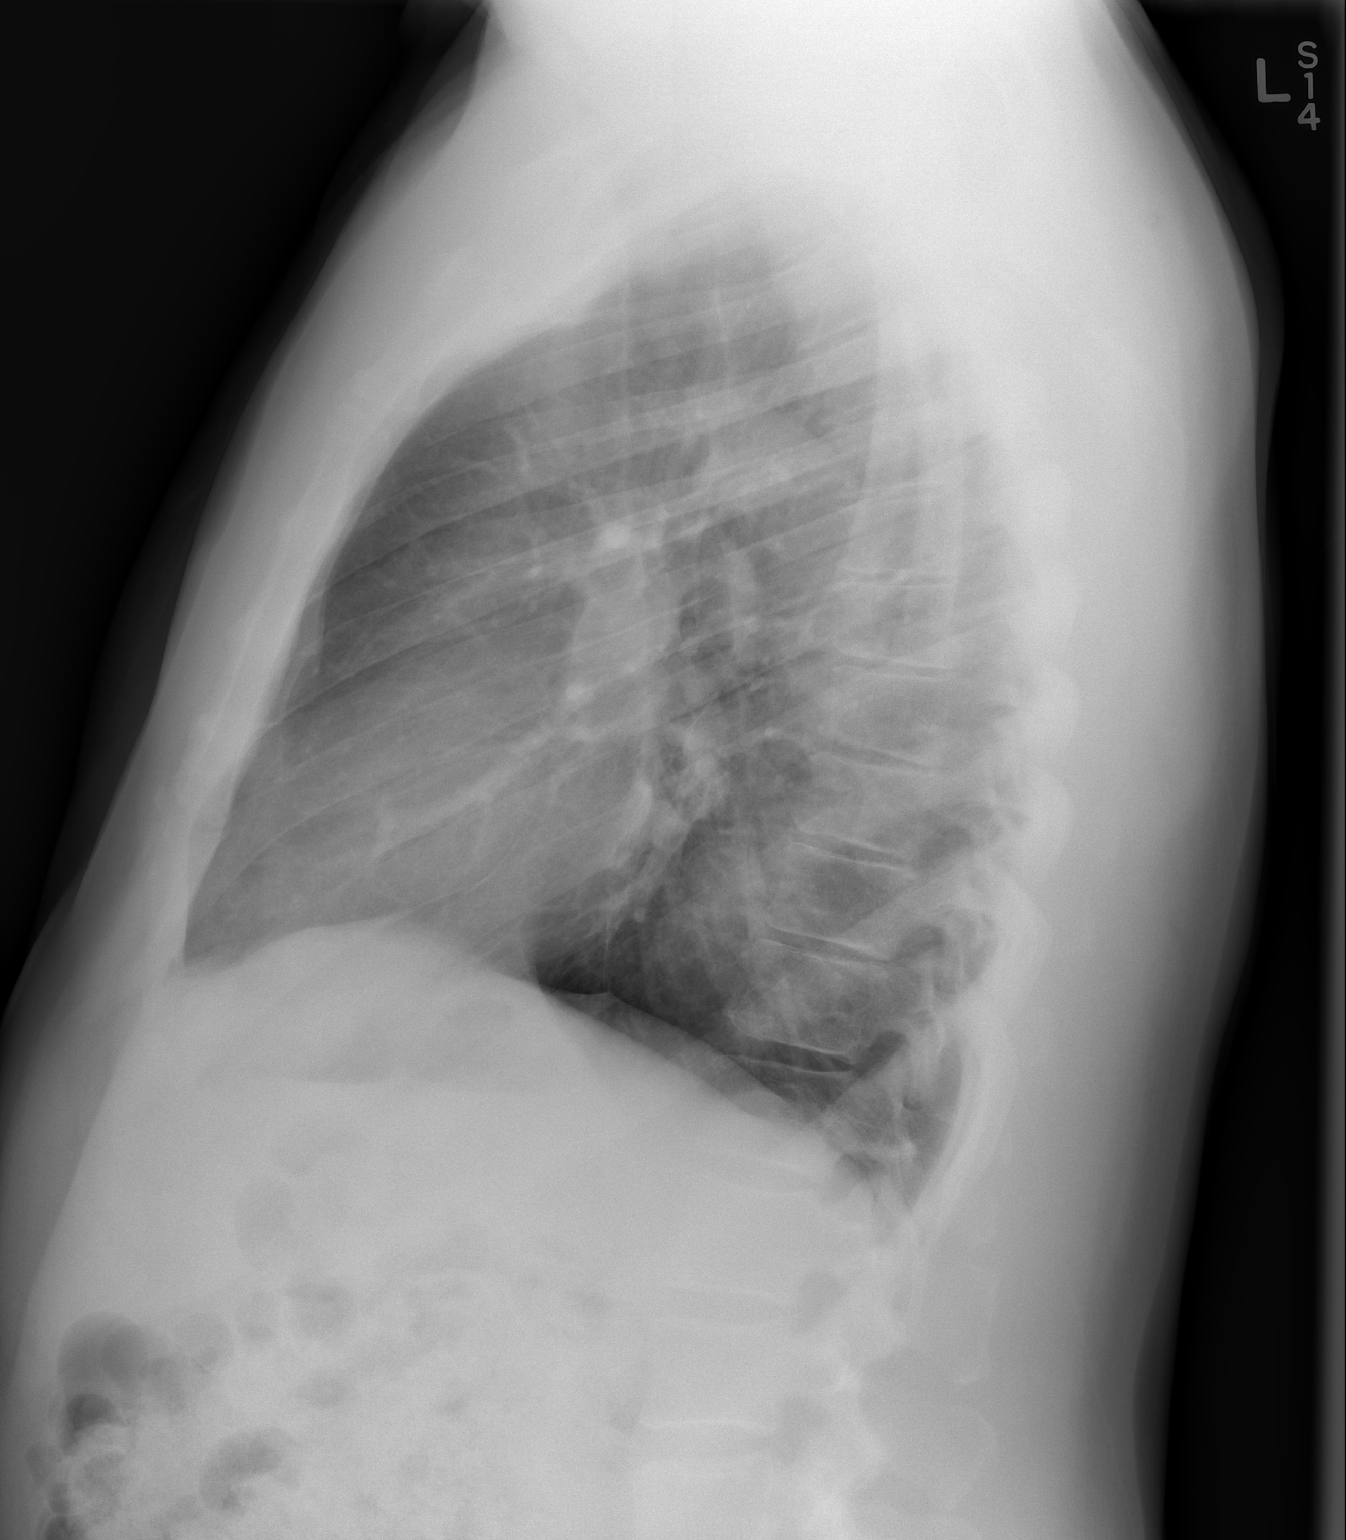

[2 of 2 positions shown; findings below may reference images not displayed]

FINDINGS: The heart size and mediastinal contours are within normal limits. No
acute infiltrate or pulmonary edema. The visualized skeletal
structures are stable.
IMPRESSION: No active cardiopulmonary disease.  No significant change.
